# Patient Record
Sex: Female | Born: 1960 | Race: Black or African American | Hispanic: No | Marital: Married | Smoking: Never smoker
Health system: Southern US, Community
[De-identification: ages and names within clinical notes are randomized; demographics above are authoritative.]

## PROBLEM LIST (undated history)

## (undated) DIAGNOSIS — I10 Essential (primary) hypertension: Secondary | ICD-10-CM

## (undated) DIAGNOSIS — K56609 Unspecified intestinal obstruction, unspecified as to partial versus complete obstruction: Secondary | ICD-10-CM

## (undated) HISTORY — PX: ABDOMINAL SURGERY: SHX537

---

## 2006-07-21 ENCOUNTER — Emergency Department: Payer: Self-pay | Admitting: Emergency Medicine

## 2006-07-22 ENCOUNTER — Emergency Department: Payer: Self-pay

## 2013-08-16 ENCOUNTER — Emergency Department: Payer: Self-pay | Admitting: Emergency Medicine

## 2013-08-16 LAB — COMPREHENSIVE METABOLIC PANEL
ALBUMIN: 3.1 g/dL — AB (ref 3.4–5.0)
AST: 34 U/L (ref 15–37)
Alkaline Phosphatase: 33 U/L — ABNORMAL LOW
Anion Gap: 8 (ref 7–16)
BUN: 12 mg/dL (ref 7–18)
Bilirubin,Total: 0.3 mg/dL (ref 0.2–1.0)
CALCIUM: 8.5 mg/dL (ref 8.5–10.1)
CO2: 23 mmol/L (ref 21–32)
Chloride: 109 mmol/L — ABNORMAL HIGH (ref 98–107)
Creatinine: 0.86 mg/dL (ref 0.60–1.30)
EGFR (African American): >60
EGFR (Non-African Amer.): >60
Glucose: 139 mg/dL — ABNORMAL HIGH (ref 65–99)
Osmolality: 281 (ref 275–301)
Potassium: 4.5 mmol/L (ref 3.5–5.1)
SGPT (ALT): 24 U/L (ref 12–78)
Sodium: 140 mmol/L (ref 136–145)
Total Protein: 6.1 g/dL — ABNORMAL LOW (ref 6.4–8.2)

## 2013-08-16 LAB — CBC
HCT: 46.1 % (ref 35.0–47.0)
HGB: 15.2 g/dL (ref 12.0–16.0)
MCH: 30.2 pg (ref 26.0–34.0)
MCHC: 33 g/dL (ref 32.0–36.0)
MCV: 92 fL (ref 80–100)
Platelet: 155 10*3/uL (ref 150–440)
RBC: 5.04 10*6/uL (ref 3.80–5.20)
RDW: 13.4 % (ref 11.5–14.5)
WBC: 8.5 10*3/uL (ref 3.6–11.0)

## 2013-08-16 LAB — TROPONIN I: Troponin-I: <0.02 ng/mL

## 2014-01-18 ENCOUNTER — Emergency Department (HOSPITAL_BASED_OUTPATIENT_CLINIC_OR_DEPARTMENT_OTHER): Payer: No Typology Code available for payment source

## 2014-01-18 ENCOUNTER — Encounter (HOSPITAL_BASED_OUTPATIENT_CLINIC_OR_DEPARTMENT_OTHER): Payer: Self-pay

## 2014-01-18 ENCOUNTER — Emergency Department (HOSPITAL_BASED_OUTPATIENT_CLINIC_OR_DEPARTMENT_OTHER)
Admission: EM | Admit: 2014-01-18 | Discharge: 2014-01-18 | Disposition: A | Discharge status: Other Acute Inpatient Hospital | Payer: No Typology Code available for payment source | Attending: Emergency Medicine | Admitting: Emergency Medicine

## 2014-01-18 DIAGNOSIS — R531 Weakness: Secondary | ICD-10-CM | POA: Insufficient documentation

## 2014-01-18 DIAGNOSIS — K567 Ileus, unspecified: Secondary | ICD-10-CM

## 2014-01-18 DIAGNOSIS — Z8719 Personal history of other diseases of the digestive system: Secondary | ICD-10-CM | POA: Diagnosis not present

## 2014-01-18 DIAGNOSIS — Y838 Other surgical procedures as the cause of abnormal reaction of the patient, or of later complication, without mention of misadventure at the time of the procedure: Secondary | ICD-10-CM | POA: Diagnosis not present

## 2014-01-18 DIAGNOSIS — Z79899 Other long term (current) drug therapy: Secondary | ICD-10-CM | POA: Insufficient documentation

## 2014-01-18 DIAGNOSIS — T814XXA Infection following a procedure, initial encounter: Secondary | ICD-10-CM | POA: Diagnosis present

## 2014-01-18 DIAGNOSIS — R112 Nausea with vomiting, unspecified: Secondary | ICD-10-CM | POA: Insufficient documentation

## 2014-01-18 DIAGNOSIS — T8140XA Infection following a procedure, unspecified, initial encounter: Secondary | ICD-10-CM

## 2014-01-18 DIAGNOSIS — G8918 Other acute postprocedural pain: Secondary | ICD-10-CM | POA: Diagnosis not present

## 2014-01-18 DIAGNOSIS — R109 Unspecified abdominal pain: Secondary | ICD-10-CM | POA: Diagnosis not present

## 2014-01-18 DIAGNOSIS — R5383 Other fatigue: Secondary | ICD-10-CM | POA: Insufficient documentation

## 2014-01-18 DIAGNOSIS — I10 Essential (primary) hypertension: Secondary | ICD-10-CM | POA: Insufficient documentation

## 2014-01-18 HISTORY — DX: Essential (primary) hypertension: I10

## 2014-01-18 HISTORY — DX: Unspecified intestinal obstruction, unspecified as to partial versus complete obstruction: K56.609

## 2014-01-18 LAB — CBC WITH DIFFERENTIAL/PLATELET
BASOS PCT: 0 % (ref 0–1)
Basophils Absolute: 0 10*3/uL (ref 0.0–0.1)
Eosinophils Absolute: 0.3 10*3/uL (ref 0.0–0.7)
Eosinophils Relative: 3 % (ref 0–5)
HCT: 30.8 % — ABNORMAL LOW (ref 36.0–46.0)
Hemoglobin: 9.7 g/dL — ABNORMAL LOW (ref 12.0–15.0)
Lymphocytes Relative: 13 % (ref 12–46)
Lymphs Abs: 1.3 10*3/uL (ref 0.7–4.0)
MCH: 29.4 pg (ref 26.0–34.0)
MCHC: 31.5 g/dL (ref 30.0–36.0)
MCV: 93.3 fL (ref 78.0–100.0)
MONO ABS: 0.9 10*3/uL (ref 0.1–1.0)
Monocytes Relative: 9 % (ref 3–12)
NEUTROS ABS: 7.8 10*3/uL — AB (ref 1.7–7.7)
NEUTROS PCT: 75 % (ref 43–77)
Platelets: 268 10*3/uL (ref 150–400)
RBC: 3.3 MIL/uL — ABNORMAL LOW (ref 3.87–5.11)
RDW: 13.4 % (ref 11.5–15.5)
WBC: 10.4 10*3/uL (ref 4.0–10.5)

## 2014-01-18 LAB — COMPREHENSIVE METABOLIC PANEL
ALBUMIN: 3.2 g/dL — AB (ref 3.5–5.2)
ALK PHOS: 86 U/L (ref 39–117)
ALT: 25 U/L (ref 0–35)
ANION GAP: 18 — AB (ref 5–15)
AST: 22 U/L (ref 0–37)
BUN: 8 mg/dL (ref 6–23)
CO2: 19 mEq/L (ref 19–32)
Calcium: 9.6 mg/dL (ref 8.4–10.5)
Chloride: 101 mEq/L (ref 96–112)
Creatinine, Ser: 0.7 mg/dL (ref 0.50–1.10)
GFR calc Af Amer: >90 mL/min (ref >90)
GFR calc non Af Amer: >90 mL/min (ref >90)
Glucose, Bld: 104 mg/dL — ABNORMAL HIGH (ref 70–99)
POTASSIUM: 4.1 meq/L (ref 3.7–5.3)
SODIUM: 138 meq/L (ref 137–147)
TOTAL PROTEIN: 7.5 g/dL (ref 6.0–8.3)
Total Bilirubin: 0.3 mg/dL (ref 0.3–1.2)

## 2014-01-18 LAB — URINALYSIS, ROUTINE W REFLEX MICROSCOPIC
Bilirubin Urine: NEGATIVE
Glucose, UA: NEGATIVE mg/dL
HGB URINE DIPSTICK: NEGATIVE
Ketones, ur: NEGATIVE mg/dL
LEUKOCYTES UA: NEGATIVE
NITRITE: NEGATIVE
Protein, ur: NEGATIVE mg/dL
SPECIFIC GRAVITY, URINE: 1.006 (ref 1.005–1.030)
UROBILINOGEN UA: 0.2 mg/dL (ref 0.0–1.0)
pH: 6 (ref 5.0–8.0)

## 2014-01-18 LAB — OCCULT BLOOD X 1 CARD TO LAB, STOOL: FECAL OCCULT BLD: NEGATIVE

## 2014-01-18 LAB — LIPASE, BLOOD: Lipase: 143 U/L — ABNORMAL HIGH (ref 11–59)

## 2014-01-18 MED ORDER — IOHEXOL 300 MG/ML  SOLN
50.0000 mL | Freq: Once | INTRAMUSCULAR | Status: AC | PRN
  Administered 2014-01-18: 50 mL via ORAL

## 2014-01-18 MED ORDER — IOHEXOL 300 MG/ML  SOLN
100.0000 mL | Freq: Once | INTRAMUSCULAR | Status: AC | PRN
  Administered 2014-01-18: 100 mL via INTRAVENOUS

## 2014-01-18 MED ORDER — PIPERACILLIN-TAZOBACTAM 3.375 G IVPB
3.3750 g | Freq: Once | INTRAVENOUS | Status: AC
  Administered 2014-01-18: 3.375 g via INTRAVENOUS
  Filled 2014-01-18: qty 50

## 2014-01-18 NOTE — ED Provider Notes (Signed)
CSN: [id number]     Arrival date & time 01/18/14  [id number] History   First MD Initiated Contact with Patient 01/18/14 1007     Chief Complaint  Patient presents with  . post op bleeding      (Consider location/radiation/quality/duration/timing/severity/associated sxs/prior Treatment) HPI 53 y.o. 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  Patient discharged November 16.  Returned to hospital on November 17 with nausea and vomiting and had sbo.  Patient treated medically until surgery December 9 done open.  Patient required post op tpn through picc line and was discharged December 17.  Patient statse she has had continued pain, oozing, drainage, diarrhea, today had vomiting with brb x 1.  No fever, chills.  Patient previously healthy except hypertension.   Patient did not have anything to eat or drink today except contrast here due to nausea.  Past Medical History  Diagnosis Date  . Hypertension   . Bowel obstruction    History reviewed. No pertinent past surgical history. No family history on file. History  Substance Use Topics  . Smoking status: Never Smoker   . Smokeless tobacco: Not on file  . Alcohol Use: Not on file   OB History    No data available     Review of Systems  Constitutional: Positive for fatigue. Negative for fever, chills, diaphoresis and unexpected weight change.  HENT: Negative.   Eyes: Negative.   Cardiovascular: Negative.   Gastrointestinal: Positive for nausea, vomiting and blood in stool.      Allergies  Milk of magnesia  Home Medications   Prior to Admission medications   Medication Sig Start Date End Date Taking? Authorizing Provider  enoxaparin (LOVENOX) 100 MG/ML injection Inject 100 mg into the skin.   Yes Historical Provider, MD  gabapentin (NEURONTIN) 100 MG capsule Take 100 mg by mouth 3 (three) times daily.   Yes Historical Provider, MD  oxycodone-acetaminophen (NARVOX) 10-500 MG per tablet Take 1 tablet by  mouth every 4 (four) hours as needed for pain.   Yes Historical Provider, MD  pantoprazole (PROTONIX) 40 MG tablet Take 40 mg by mouth daily.   Yes Historical Provider, MD   BP 143/74 mmHg  Pulse 98  Temp(Src) 98.4 F (36.9 C) (Oral)  Resp 16  Wt 166 lb (75.297 kg)  SpO2 99%  LMP  Physical Exam  Constitutional: She is oriented to person, place, and time. She appears well-developed and well-nourished.  HENT:  Head: Normocephalic and atraumatic.  Eyes: Conjunctivae and EOM are normal. Pupils are equal, round, and reactive to light.  Neck: Normal range of motion. Neck supple.  Abdominal: Soft. There is tenderness.  Midline staples in place with large incision with brown discharge.   Musculoskeletal: She exhibits no edema or tenderness.  Neurological: She is alert and oriented to person, place, and time. She has normal reflexes.  Skin: Skin is warm and dry.  Psychiatric: She has a normal mood and affect. Her behavior is normal. Judgment and thought content normal.  Nursing note and vitals reviewed.   ED Course  Procedures (including critical care time) Labs Review Labs Reviewed  CBC WITH DIFFERENTIAL - Abnormal; Notable for the following:    RBC 3.30 (*)    Hemoglobin 9.7 (*)    HCT 30.8 (*)    Neutro Abs 7.8 (*)    All other components within normal limits  COMPREHENSIVE METABOLIC PANEL - Abnormal; Notable for the following:    Glucose, Bld 104 (*)  Albumin 3.2 (*)    Anion gap 18 (*)    All other components within normal limits  LIPASE, BLOOD - Abnormal; Notable for the following:    Lipase 143 (*)    All other components within normal limits  URINALYSIS, ROUTINE W REFLEX MICROSCOPIC  OCCULT BLOOD X 1 CARD TO LAB, STOOL    Imaging Review Ct Abdomen Pelvis W Contrast  01/18/2014   CLINICAL DATA:  Patient with prior abdominal surgery for small bowel obstruction. Discharge from the midline abdominal incision.  EXAM: CT ABDOMEN AND PELVIS WITH CONTRAST  TECHNIQUE:  Multidetector CT imaging of the abdomen and pelvis was performed using the standard protocol following bolus administration of intravenous contrast.  CONTRAST:  100mL OMNIPAQUE IOHEXOL 300 MG/ML SOLN, 50mL OMNIPAQUE IOHEXOL 300 MG/ML SOLN  COMPARISON:  None.  FINDINGS: Visualization of the lower thorax demonstrates dependent atelectasis within the bilateral lower lobes. Normal heart size.  The liver is normal in size and contour. Probable layering sludge within the gallbladder lumen. Spleen is unremarkable. Pancreas is unremarkable. Normal bilateral adrenal glands. Exophytic cyst off the superior pole of the left kidney. Kidneys enhance symmetrically with contrast.  Normal caliber abdominal aorta. Suggestion of central filling defect within the left external iliac vein (image 70; series 2). Urinary bladder is unremarkable. Uterus is unremarkable.  Postsurgical changes compatible with midline laparotomy. Fluid and gas is demonstrated within the mid line surgical site demonstrated best on image 52; series 2). Additionally there is a small amount of gas deep to the muscle (image 38; series 2). Enteric colonic anastomosis is demonstrated within the central abdomen. Status post right hemicolectomy. Prominent loops of small bowel are demonstrated throughout the abdomen without definite transition point identified. There is mild wall thickening of the descending colon and rectum.  Lower lumbar spine degenerative changes. No aggressive or acute appearing osseous lesions. Soft tissue anasarca.  IMPRESSION: Postsurgical midline laparotomy changes containing fluid and gas. There are few foci of gas deep to the muscle, favored to be postoperative in etiology. While findings are likely all postoperative in etiology, superimposed infection is not excluded.  Postoperative changes to the bowel compatible with right hemicolectomy. There is mildly dilated small bowel without definite transition point identified. Overall findings are  most compatible with postoperative ileus.  Mild rectal wall thickening, likely postprocedural etiology.  Suggestion of filling defect within the left external iliac vein, potentially secondary to inflow artifact. Thrombus is not excluded. Recommend correlation with ultrasound.  These results were called by telephone at the time of interpretation on 01/18/2014 at 2:05 pm to Dr. Margarita GrizzleANIELLE Ariahna Smiddy , who verbally acknowledged these results.   Electronically Signed   By: Annia Beltrew  Davis M.D.   On: 01/18/2014 14:08     EKG Interpretation None      MDM   Final diagnoses:  Postoperative pain  Weak  Postoperative infection, initial encounter  Ileus   Surgeon Dr. Marin OlpMigaly, [facility] pmd Dr. Greggory StallionGeorge, pmd [facility] [place]   1- s/p colon resection here with hematemesis, pain, and discharge from wound- ct with ileus, midline changes with fluid and gas- postop vs infection.  Patient given IV zosyn here.  2-ileus 3- filling defect left extternal iliac vein- consider thrombus- patient on lovenox 4- pancreatitis- lipase elevated at 143. [facility]. I spoke with Dr. Channing MuttersNag and I am waiting to speak with the attending surgeon.  Discussed with Dr. Marin OlpMigaly and plan transfer to [facility]   Karl Erway S Lavine Hargrove, MD 01/18/14  1633 

## 2014-01-18 NOTE — ED Notes (Signed)
Patient here due to drainage and reported bleeding [facility] that they were told was due to adhesions. Patient reports this am had bright red emesis.

## 2014-01-18 NOTE — ED Notes (Signed)
[facility]

## 2014-01-18 NOTE — ED Notes (Signed)
[facility]. [facility] unable to transport patient until after shift change, and the [facility] [place] [facility] [place] [facility] [place]. [facility] transport called also, would not arrive until at least 9pm.

## 2014-02-17 ENCOUNTER — Emergency Department (HOSPITAL_BASED_OUTPATIENT_CLINIC_OR_DEPARTMENT_OTHER)
Admission: EM | Admit: 2014-02-17 | Discharge: 2014-02-17 | Disposition: A | Discharge status: Home / Self Care | Payer: No Typology Code available for payment source | Attending: Emergency Medicine | Admitting: Emergency Medicine

## 2014-02-17 ENCOUNTER — Encounter (HOSPITAL_BASED_OUTPATIENT_CLINIC_OR_DEPARTMENT_OTHER): Payer: Self-pay | Admitting: Emergency Medicine

## 2014-02-17 DIAGNOSIS — T8131XA Disruption of external operation (surgical) wound, not elsewhere classified, initial encounter: Secondary | ICD-10-CM

## 2014-02-17 DIAGNOSIS — K9189 Other postprocedural complications and disorders of digestive system: Secondary | ICD-10-CM | POA: Insufficient documentation

## 2014-02-17 DIAGNOSIS — Y848 Other medical procedures as the cause of abnormal reaction of the patient, or of later complication, without mention of misadventure at the time of the procedure: Secondary | ICD-10-CM | POA: Insufficient documentation

## 2014-02-17 DIAGNOSIS — Z79899 Other long term (current) drug therapy: Secondary | ICD-10-CM | POA: Diagnosis not present

## 2014-02-17 DIAGNOSIS — I10 Essential (primary) hypertension: Secondary | ICD-10-CM | POA: Diagnosis not present

## 2014-02-17 DIAGNOSIS — R21 Rash and other nonspecific skin eruption: Secondary | ICD-10-CM

## 2014-02-17 NOTE — ED Notes (Signed)
Reports 2 abd surgeries recently with ?post-op infection, denies fever, surgical site draining pus, denies pain, A/OX 4, ambulatory and in NAD

## 2014-02-17 NOTE — ED Notes (Addendum)
Pt reports having 2 abd surgeries for polyp removal in November 12 and December 9. Patient states she was here recently d/t infection . Pt stated she was placed on ABX and instructed  to pack wound. Has complied with tx. Pt reports noticing 2 wounds 1 week ago. Denies fever. Reports some N/V, and diarrhea Decreased PO intake. But able to keep food down.

## 2014-02-17 NOTE — ED Provider Notes (Signed)
CSN: [id number]     Arrival date & time 02/17/14  [id number] History   First MD Initiated Contact with Patient 02/17/14 1003     Chief Complaint  Patient presents with  . Post-op Problem     (Consider location/radiation/quality/duration/timing/severity/associated sxs/prior Treatment) HPI Comments: The patient is a very pleasant 54 year old female who unfortunately had to undergo a partial colectomy in December. She presents back to the hospital today worried that she has a surgical site abnormality which she described as having some of the tissue coming out with some drainage. She also reports some disclamation of her bilateral hands and feet on the palmar and plantar services. Her abdominal pain is somewhat chronic, gradually improving and not associated with distention vomiting or fevers. The discharge is yellowish, it does not smell foul, she does not have fevers. Her hand disclamation has also been going on ever since being discharged from the hospital. 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 She is no longer taking the amoxicillin or the significant pain medications which she was taking in the past.  The history is provided by the patient.    Past Medical History  Diagnosis Date  . Hypertension   . Bowel obstruction    Past Surgical History  Procedure Laterality Date  . Abdominal surgery     No family history on file. History  Substance Use Topics  . Smoking status: Never Smoker   . Smokeless tobacco: Not on file  . Alcohol Use: No   OB History    No data available     Review of Systems  All other systems reviewed and are negative.     Allergies  Milk of magnesia  Home Medications   Prior to Admission medications   Medication Sig Start Date End Date Taking? Authorizing Provider  enoxaparin (LOVENOX) 100 MG/ML injection Inject 100 mg into the skin.    Historical Provider, MD   oxycodone-acetaminophen (NARVOX) 10-500 MG per tablet Take 1 tablet by mouth every 4 (four) hours as needed for pain.    Historical Provider, MD  pantoprazole (PROTONIX) 40 MG tablet Take 40 mg by mouth daily.    Historical Provider, MD   BP 161/64 mmHg  Pulse 90  Temp(Src) 98.1 F (36.7 C) (Oral)  Resp 18  SpO2 99% Physical Exam  Constitutional: She appears well-developed and well-nourished. No distress.  HENT:  Head: Normocephalic and atraumatic.  Mouth/Throat: Oropharynx is clear and moist. No oropharyngeal exudate.  Eyes: Conjunctivae and EOM are normal. Pupils are equal, round, and reactive to light. Right eye exhibits no discharge. Left eye exhibits no discharge. No scleral icterus.  Neck: Normal range of motion. Neck supple. No JVD present. No thyromegaly present.  Cardiovascular: Normal rate, regular rhythm, normal heart sounds and intact distal pulses.  Exam reveals no gallop and no friction rub.   No murmur heard. Pulmonary/Chest: Effort normal and breath sounds normal. No respiratory distress. She has no wheezes. She has no rales.  Abdominal: Soft. Bowel sounds are normal. She exhibits no distension and no mass. There is no tenderness.  Anterior ventral wall incision is well-healed, there are 2 very small areas that are 2-3 mm in size where there is a small amount of tissue protruding, this is healthy pink granulating tissue, there is no discharge, no foul smell, no drainage.  Musculoskeletal: Normal range of motion. She exhibits no edema or tenderness.  Lymphadenopathy:    She has no cervical  adenopathy.  Neurological: She is alert. Coordination normal.  Skin: Skin is warm and dry. No rash noted. No erythema.  Desquamation of the palms and soles, no redness, no swelling, no tenderness  Psychiatric: She has a normal mood and affect. Her behavior is normal.  Nursing note and vitals reviewed.   ED Course  Procedures (including critical care time) Labs Review Labs Reviewed  - No data to display  Imaging Review No results found.    MDM   Final diagnoses:  Rash  Wound disruption, post-op, skin, initial encounter    Will stop Neurontin as this may be the cause of the desquamation, the abdominal wall surgical wounds are healing well, there is some amount of secondary intent required as the wounds didn't dehisce a small amount less than 3 mm at 2 different sites. I've explained to the patient at this is normal, to avoid peroxide use, routine follow-up and dressing care. The patient has expressed her understanding. She otherwise appears well, vital signs suggest mild hypertension but no fever or signs of infection, no indication for advanced imaging. The patient is in agreement with the plan.  Encouraged two-week follow-up with surgeon, understanding expressed  Vida Roller, MD 02/17/14 1046

## 2014-02-17 NOTE — ED Notes (Signed)
Pt ambulatory , Ox4, NAD. 0/10 prior to dc.

## 2016-08-24 IMAGING — CT CT ABD-PELV W/ CM
2 of 5 series · 15 of 46 positions shown, 17 images · IV contrast (APPLIED)
Comparison: None.

CLINICAL DATA: Patient with prior abdominal surgery for small bowel
obstruction. Discharge from the midline abdominal incision.

EXAM:
CT ABDOMEN AND PELVIS WITH CONTRAST
TECHNIQUE: Multidetector CT imaging of the abdomen and pelvis was performed
using the standard protocol following bolus administration of
intravenous contrast.
CONTRAST:  100mL OMNIPAQUE IOHEXOL 300 MG/ML SOLN, 50mL OMNIPAQUE
IOHEXOL 300 MG/ML SOLN

[Series 2: abd/pelvis 5.0 b31f · axial · 0.85mm/px · z∈[-372,+33]mm · 12 of 91 slices shown, 14 images]
[im 5/91  soft-tissue]
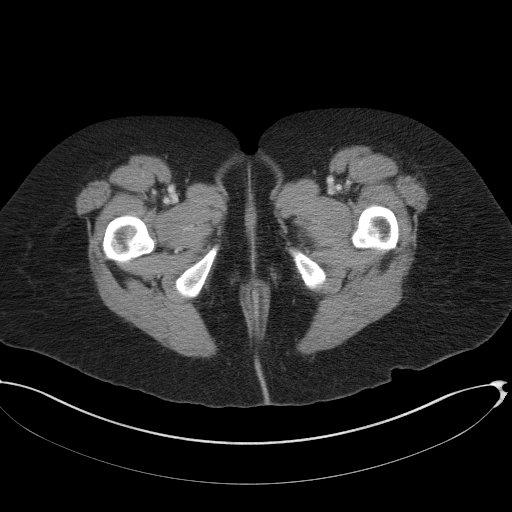
[im 5/91  bone]
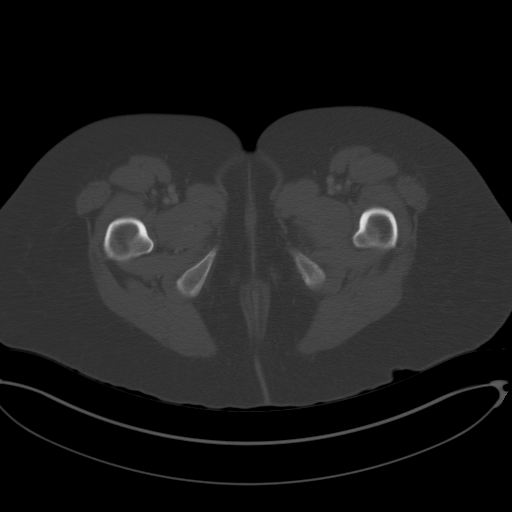
[im 15/91  soft-tissue]
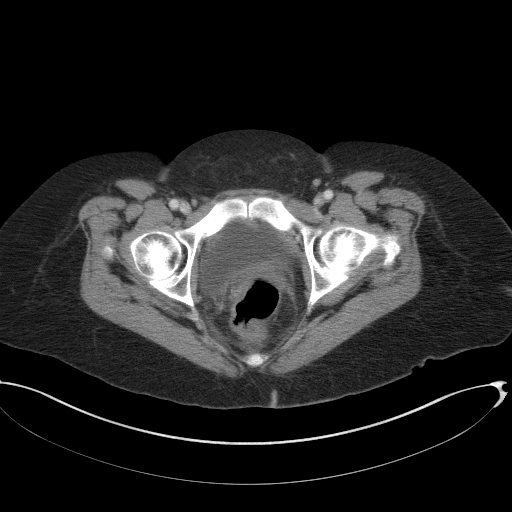
[im 19/91  soft-tissue]
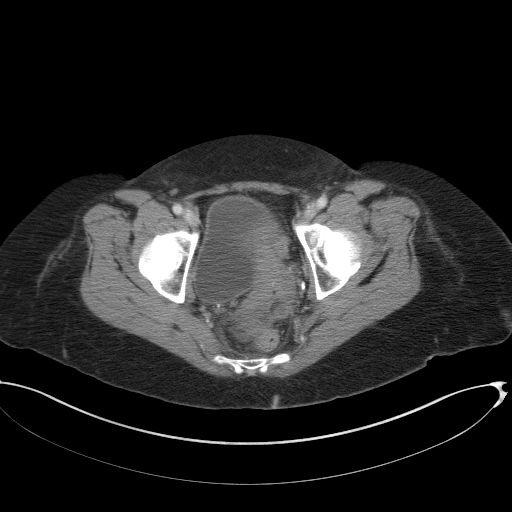
[im 29/91  soft-tissue]
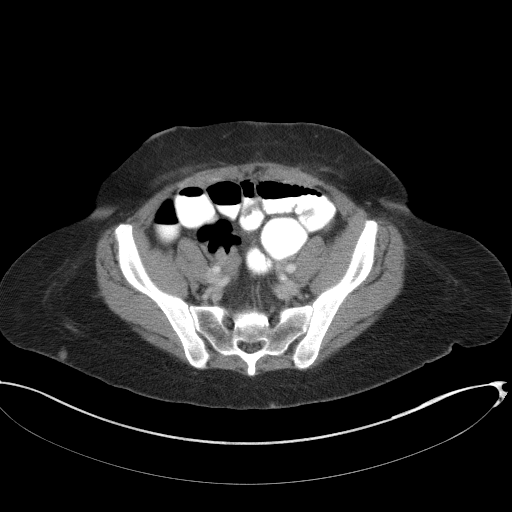
[im 34/91  soft-tissue]
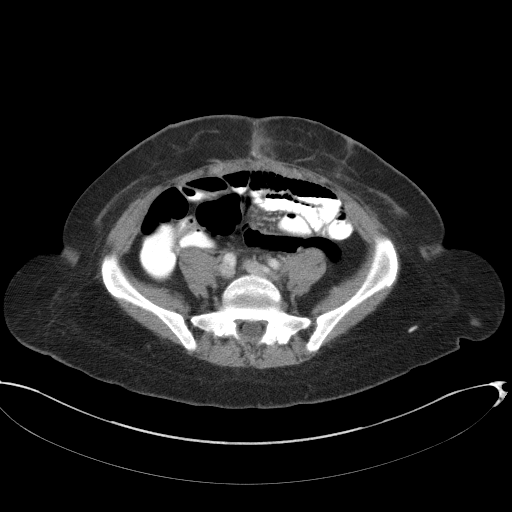
[im 43/91  soft-tissue]
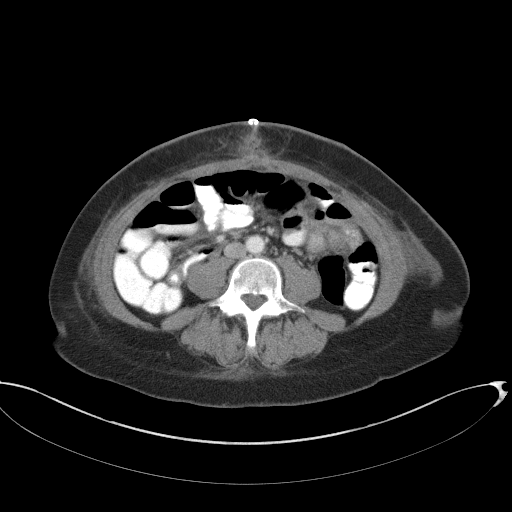
[im 48/91  soft-tissue]
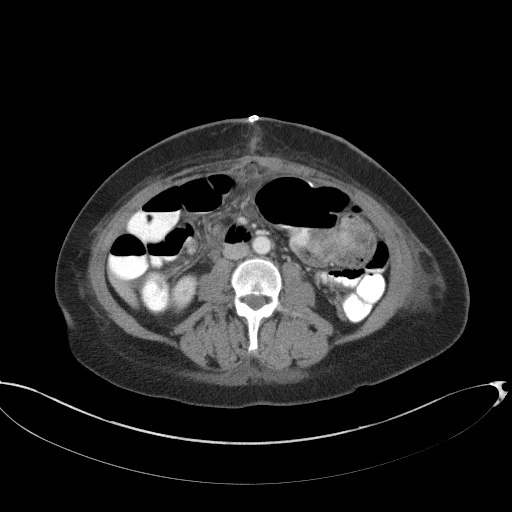
[im 57/91  soft-tissue]
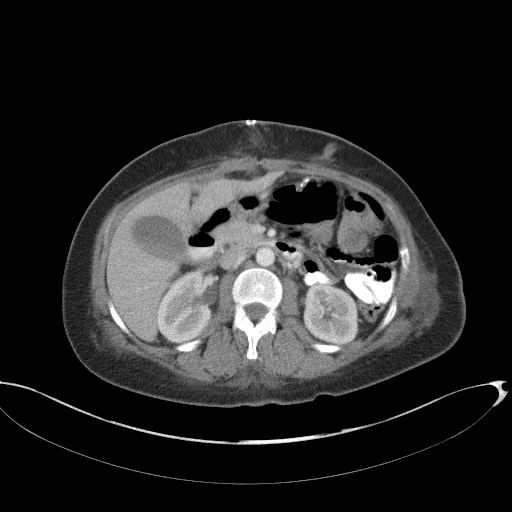
[im 62/91  soft-tissue]
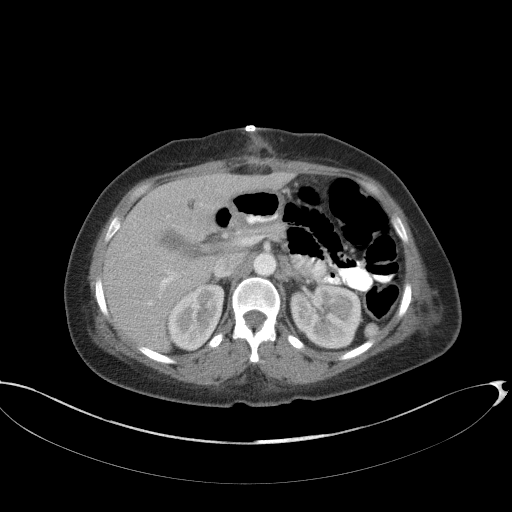
[im 62/91  bone]
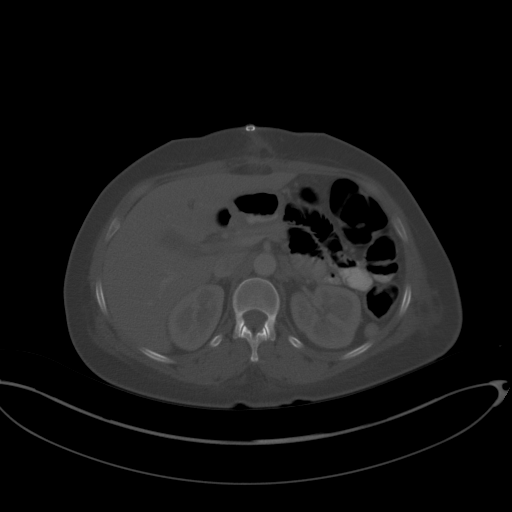
[im 72/91  soft-tissue]
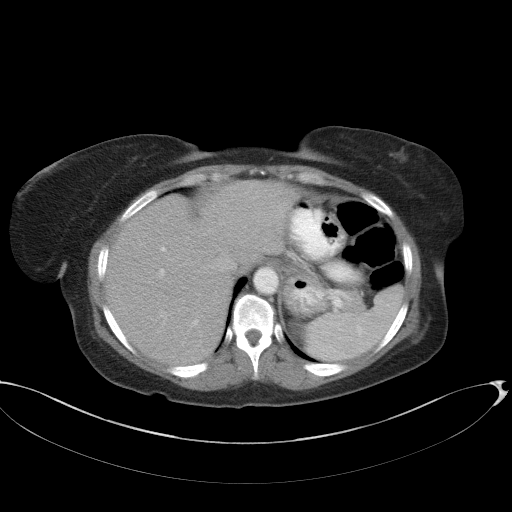
[im 76/91  soft-tissue]
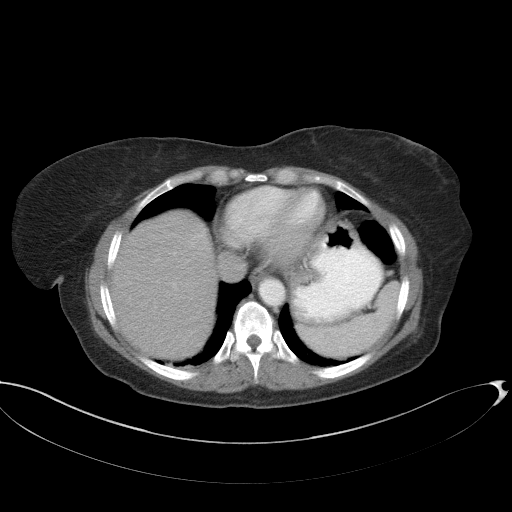
[im 86/91  soft-tissue]
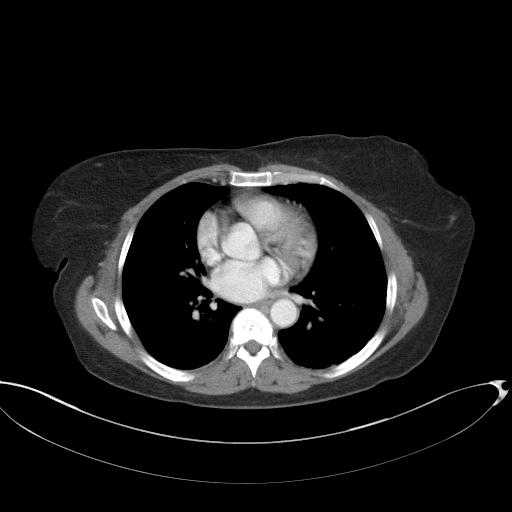

[Series 5: abd/pelvis 3.0 coronal · coronal · 0.70mm/px · 3 of 75 slices shown]
[im 25/75  soft-tissue]
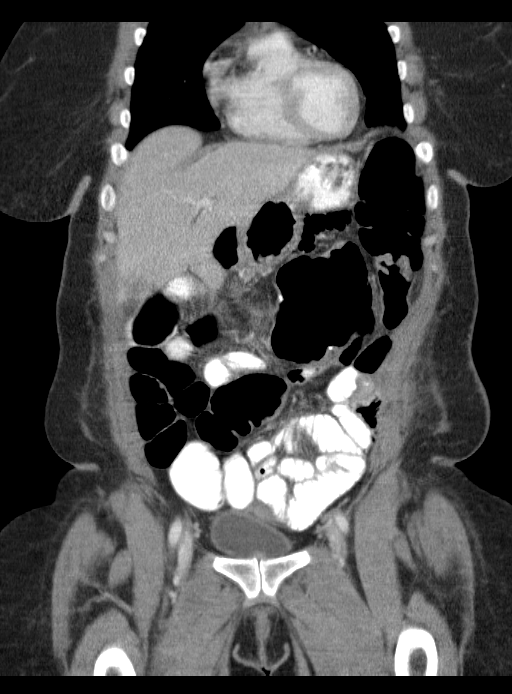
[im 33/75  soft-tissue]
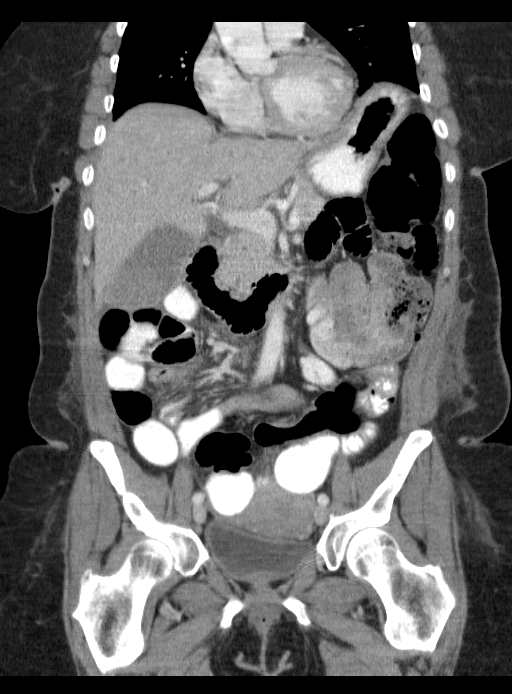
[im 42/75  soft-tissue]
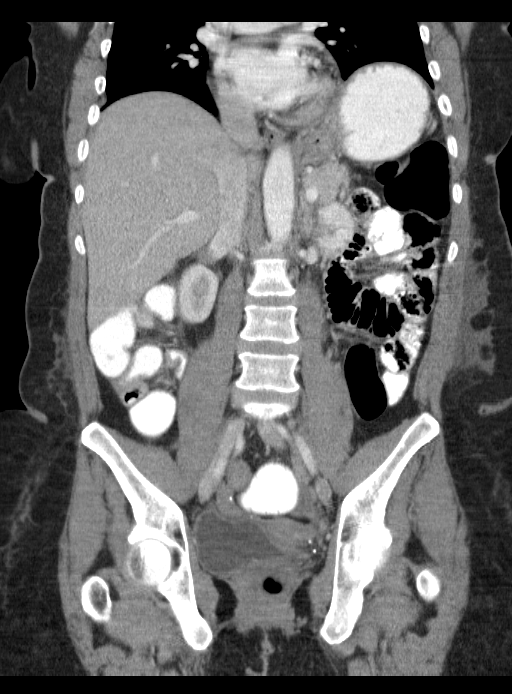

[15 of 46 positions shown; findings below may reference images not displayed]

FINDINGS: Visualization of the lower thorax demonstrates dependent atelectasis
within the bilateral lower lobes. Normal heart size.

The liver is normal in size and contour. Probable layering sludge
within the gallbladder lumen. Spleen is unremarkable. Pancreas is
unremarkable. Normal bilateral adrenal glands. Exophytic cyst off
the superior pole of the left kidney. Kidneys enhance symmetrically
with contrast.

Normal caliber abdominal aorta. Suggestion of central filling defect
within the left external iliac vein (image 70; series 2). Urinary
bladder is unremarkable. Uterus is unremarkable.

Postsurgical changes compatible with midline laparotomy. Fluid and
gas is demonstrated within the mid line surgical site demonstrated
best on image 52; series 2). Additionally there is a small amount of
gas deep to the muscle (image 38; series 2). Enteric colonic
anastomosis is demonstrated within the central abdomen. Status post
right hemicolectomy. Prominent loops of small bowel are demonstrated
throughout the abdomen without definite transition point identified.
There is mild wall thickening of the descending colon and rectum.

Lower lumbar spine degenerative changes. No aggressive or acute
appearing osseous lesions. Soft tissue anasarca.
IMPRESSION: Postsurgical midline laparotomy changes containing fluid and gas.
There are few foci of gas deep to the muscle, favored to be
postoperative in etiology. While findings are likely all
postoperative in etiology, superimposed infection is not excluded.

Postoperative changes to the bowel compatible with right
hemicolectomy. There is mildly dilated small bowel without definite
transition point identified. Overall findings are most compatible
with postoperative ileus.

Mild rectal wall thickening, likely postprocedural etiology.

Suggestion of filling defect within the left external iliac vein,
potentially secondary to inflow artifact. Thrombus is not excluded.
Recommend correlation with ultrasound.

These results were called by telephone at the time of interpretation
on 01/18/2014 at [DATE] to Dr. DELICIA BENNY , who verbally
acknowledged these results.
